# Patient Record
Sex: Female | Born: 1980 | Race: Asian | Hispanic: No | Marital: Married | Smoking: Never smoker
Health system: Southern US, Community
[De-identification: ages and names within clinical notes are randomized; demographics above are authoritative.]

## PROBLEM LIST (undated history)

## (undated) DIAGNOSIS — A159 Respiratory tuberculosis unspecified: Secondary | ICD-10-CM

---

## 2007-03-28 ENCOUNTER — Ambulatory Visit (HOSPITAL_COMMUNITY): Admission: RE | Admit: 2007-03-28 | Discharge: 2007-03-28 | Payer: Self-pay | Admitting: Family Medicine

## 2007-06-02 ENCOUNTER — Ambulatory Visit (HOSPITAL_COMMUNITY): Admission: RE | Admit: 2007-06-02 | Discharge: 2007-06-02 | Payer: Self-pay | Admitting: Family Medicine

## 2007-06-14 ENCOUNTER — Ambulatory Visit: Payer: Self-pay | Admitting: Family

## 2007-06-14 ENCOUNTER — Inpatient Hospital Stay (HOSPITAL_COMMUNITY): Admission: AD | Admit: 2007-06-14 | Discharge: 2007-06-17 | Payer: Self-pay | Admitting: Obstetrics and Gynecology

## 2007-06-15 ENCOUNTER — Ambulatory Visit: Payer: Self-pay | Admitting: Infectious Diseases

## 2007-12-04 ENCOUNTER — Emergency Department (HOSPITAL_COMMUNITY): Admission: EM | Admit: 2007-12-04 | Discharge: 2007-12-04 | Payer: Self-pay | Admitting: Family Medicine

## 2007-12-10 ENCOUNTER — Emergency Department (HOSPITAL_COMMUNITY): Admission: EM | Admit: 2007-12-10 | Discharge: 2007-12-10 | Payer: Self-pay | Admitting: Family Medicine

## 2009-06-01 ENCOUNTER — Emergency Department (HOSPITAL_COMMUNITY): Admission: EM | Admit: 2009-06-01 | Discharge: 2009-06-01 | Payer: Self-pay | Admitting: Emergency Medicine

## 2009-11-17 ENCOUNTER — Emergency Department (HOSPITAL_COMMUNITY)
Admission: EM | Admit: 2009-11-17 | Discharge: 2009-11-17 | Payer: Self-pay | Source: Home / Self Care | Admitting: Emergency Medicine

## 2010-04-30 LAB — COMPREHENSIVE METABOLIC PANEL
ALT: 11 U/L (ref 0–35)
AST: 18 U/L (ref 0–37)
CO2: 29 mEq/L (ref 19–32)
Calcium: 9.1 mg/dL (ref 8.4–10.5)
GFR calc Af Amer: >60 mL/min (ref >60)
GFR calc non Af Amer: >60 mL/min (ref >60)
Potassium: 3.2 mEq/L — ABNORMAL LOW (ref 3.5–5.1)
Sodium: 137 mEq/L (ref 135–145)

## 2010-04-30 LAB — URINALYSIS, ROUTINE W REFLEX MICROSCOPIC
Bilirubin Urine: NEGATIVE
Glucose, UA: NEGATIVE mg/dL
Ketones, ur: NEGATIVE mg/dL
Leukocytes, UA: NEGATIVE
pH: 6 (ref 5.0–8.0)

## 2010-04-30 LAB — DIFFERENTIAL
Eosinophils Absolute: 0.3 10*3/uL (ref 0.0–0.7)
Eosinophils Relative: 3 % (ref 0–5)
Lymphs Abs: 2.3 10*3/uL (ref 0.7–4.0)
Monocytes Relative: 7 % (ref 3–12)

## 2010-04-30 LAB — URINE MICROSCOPIC-ADD ON

## 2010-04-30 LAB — CBC
Hemoglobin: 13.5 g/dL (ref 12.0–15.0)
MCHC: 34.4 g/dL (ref 30.0–36.0)
RBC: 4.39 MIL/uL (ref 3.87–5.11)
WBC: 9.7 10*3/uL (ref 4.0–10.5)

## 2010-11-10 LAB — CBC
MCHC: 34.5
MCV: 92.6
Platelets: 194
WBC: 13.1 — ABNORMAL HIGH

## 2010-11-10 LAB — RPR: RPR Ser Ql: NONREACTIVE

## 2014-04-05 ENCOUNTER — Emergency Department (HOSPITAL_COMMUNITY): Admission: EM | Admit: 2014-04-05 | Discharge: 2014-04-05 | Discharge status: Absent without leave | Payer: Self-pay

## 2014-04-11 ENCOUNTER — Emergency Department (INDEPENDENT_AMBULATORY_CARE_PROVIDER_SITE_OTHER)
Admission: EM | Admit: 2014-04-11 | Discharge: 2014-04-11 | Disposition: A | Discharge status: Home / Self Care | Payer: Self-pay | Source: Home / Self Care | Attending: Emergency Medicine | Admitting: Emergency Medicine

## 2014-04-11 ENCOUNTER — Encounter (HOSPITAL_COMMUNITY): Payer: Self-pay | Admitting: Emergency Medicine

## 2014-04-11 ENCOUNTER — Emergency Department (INDEPENDENT_AMBULATORY_CARE_PROVIDER_SITE_OTHER): Payer: Self-pay

## 2014-04-11 DIAGNOSIS — Z09 Encounter for follow-up examination after completed treatment for conditions other than malignant neoplasm: Secondary | ICD-10-CM

## 2014-04-11 NOTE — ED Notes (Signed)
Patient is requesting a chest xray.  Patient has a history of tb, was treated.  Patient denies any symptoms.  Patient reports she was told to get chest xray every 5 years and she needs it for school.  Has no pcp

## 2014-04-11 NOTE — Discharge Instructions (Signed)
Please take this paperwork to the Occupational Health department. They will take care of getting the blood work and doing the physical. Your chest x-ray is normal.  I have provided a copy of the report.

## 2014-04-11 NOTE — ED Provider Notes (Signed)
CSN: [id number]     Arrival date & time 04/11/14  6213 History   First MD Initiated Contact with Patient 04/11/14 [phone]     Chief Complaint  Patient presents with  . Follow-up   (Consider location/radiation/quality/duration/timing/severity/associated sxs/prior Treatment) HPI  She is a 34 year old woman here for a chest x-ray. She states she had TB in 2003. She was treated. 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 She also has a form that needs to be filled out. She denies any cough, fevers, night sweats, weight loss.  History reviewed. No pertinent past medical history. History reviewed. No pertinent past surgical history. No family history on file. History  Substance Use Topics  . Smoking status: Never Smoker   . Smokeless tobacco: Not on file  . Alcohol Use: No   OB History    No data available     Review of Systems  All other systems reviewed and are negative.   Allergies  Review of patient's allergies indicates no known allergies.  Home Medications   Prior to Admission medications   Not on File   BP 123/73 mmHg  Pulse 78  Temp(Src) 98.7 F (37.1 C) (Oral)  Resp 16  Wt 120 lb (54.432 kg)  SpO2 100%  LMP 03/23/2014 Physical Exam  Constitutional: She is oriented to person, place, and time. She appears well-developed and well-nourished. No distress.  HENT:  Head: Normocephalic and atraumatic.  Right Ear: Tympanic membrane normal.  Left Ear: Tympanic membrane normal.  Nose: Nose normal.  Mouth/Throat: Oropharynx is clear and moist. No oropharyngeal exudate.  Eyes: Conjunctivae and EOM are normal. Pupils are equal, round, and reactive to light. Right eye exhibits no discharge. Left eye exhibits no discharge. No scleral icterus.  Neck: Neck supple.  Cardiovascular: Normal rate, regular rhythm and normal heart sounds.   No murmur heard. Pulmonary/Chest: Effort normal and breath sounds normal. No respiratory distress.  She has no wheezes. She has no rales.  Abdominal: Soft. Bowel sounds are normal. She exhibits no distension and no mass. There is no tenderness. There is no rebound and no guarding.  Musculoskeletal: Normal range of motion.       Cervical back: Normal.       Thoracic back: Normal.       Lumbar back: Normal.  Lymphadenopathy:    She has no cervical adenopathy.  Neurological: She is alert and oriented to person, place, and time. No cranial nerve deficit. She exhibits normal muscle tone. Coordination normal.  Skin: Skin is warm and dry. No rash noted.  Psychiatric: She has a normal mood and affect. Judgment and thought content normal.    ED Course  Procedures (including critical care time) Labs Review Labs Reviewed - No data to display  Imaging Review Dg Chest 2 View  04/11/2014   CLINICAL DATA:  Followup, positive TB test  EXAM: CHEST  2 VIEW  COMPARISON:  06/16/2007  FINDINGS: Cardiomediastinal silhouette is stable. There is no acute infiltrate or pleural effusion. No pulmonary edema. Stable somewhat nodular scarring in left upper lobe. Bony thorax is unremarkable. No adenopathy is suggested.  IMPRESSION: No active disease. No significant change. Stable somewhat nodular scarring in left upper lobe. No adenopathy is suggested.   Electronically Signed   By: Natasha Mead M.D.   On: 04/11/2014 10:21     MDM   1. Follow-up exam    Chest x-ray is negative for any active TB. I spoke with  occupational health. They will take care of the physical and lab work portion of her requirements. I provided her a copy of the x-ray report and instructed her to follow-up at occupational health.    Charm RingsErin J Honig, MD 04/11/14 1044

## 2016-09-05 IMAGING — DX DG CHEST 2V
2 series · 2 of 2 positions shown · non-contrast
Comparison: 06/16/2007

CLINICAL DATA: Followup, positive TB test

EXAM:
CHEST  2 VIEW

[chest pa]
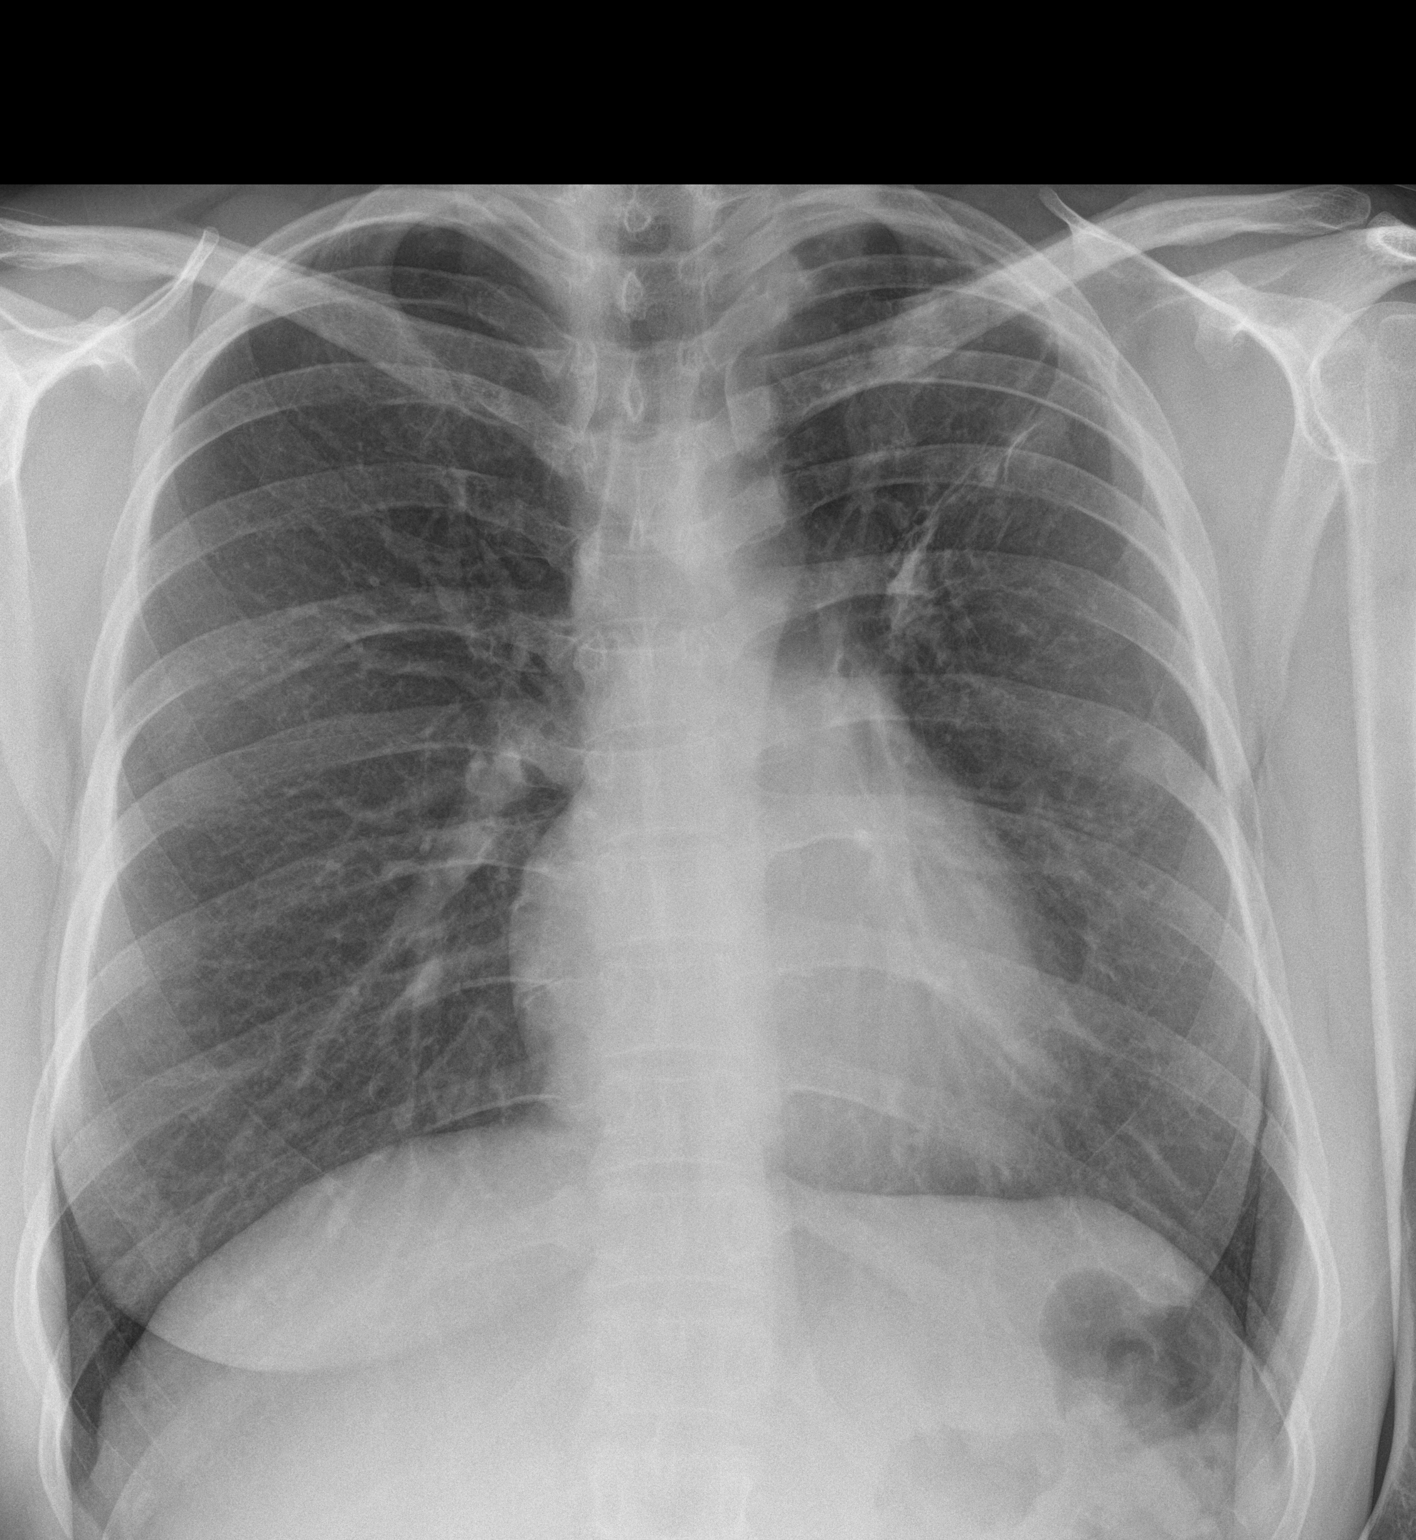

[chest lat]
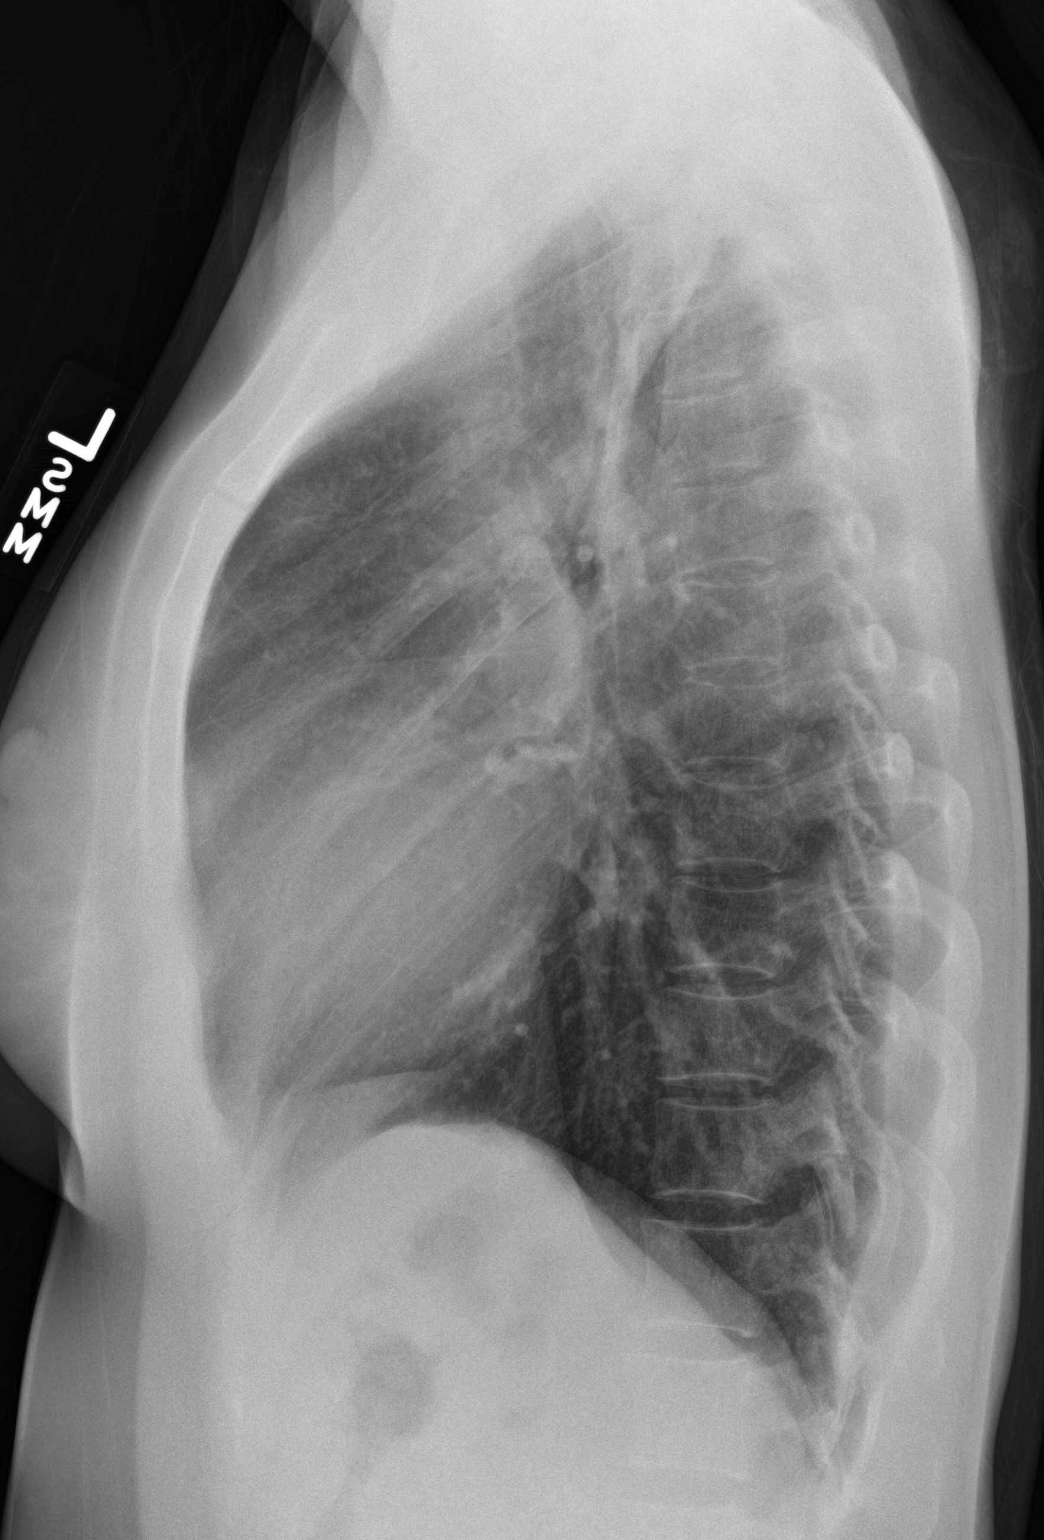

[2 of 2 positions shown; findings below may reference images not displayed]

FINDINGS: Cardiomediastinal silhouette is stable. There is no acute infiltrate
or pleural effusion. No pulmonary edema. Stable somewhat nodular
scarring in left upper lobe. Bony thorax is unremarkable. No
adenopathy is suggested.
IMPRESSION: No active disease. No significant change. Stable somewhat nodular
scarring in left upper lobe. No adenopathy is suggested.

## 2021-01-02 ENCOUNTER — Ambulatory Visit (HOSPITAL_COMMUNITY)
Admission: EM | Admit: 2021-01-02 | Discharge: 2021-01-02 | Disposition: A | Discharge status: Home / Self Care | Payer: Self-pay | Attending: Medical Oncology | Admitting: Medical Oncology

## 2021-01-02 ENCOUNTER — Other Ambulatory Visit: Payer: Self-pay

## 2021-01-02 ENCOUNTER — Encounter (HOSPITAL_COMMUNITY): Payer: Self-pay

## 2021-01-02 ENCOUNTER — Ambulatory Visit (INDEPENDENT_AMBULATORY_CARE_PROVIDER_SITE_OTHER): Payer: Self-pay

## 2021-01-02 DIAGNOSIS — R7611 Nonspecific reaction to tuberculin skin test without active tuberculosis: Secondary | ICD-10-CM

## 2021-01-02 NOTE — ED Provider Notes (Signed)
[facility]    CSN: [id number] Arrival date & time: 01/02/21  1802      History   Chief Complaint Positive PPD   HPI Rachael Boyer is a 39 y.o. female.   HPI  Positive PPD: Patient reports that she had tuberculosis in 2003.  She states that she was treated successfully and has had clearance since.  She reports that normally she does not do PPD test but and says does chest x-rays but had to perform 1 for a new employer.  It was positive.  She needs a chest x-ray.  She denies any recent cough, weight loss, fatigue, night sweats, hemoptysis, shortness of breath or chest pain.  She states that her blood pressure is elevated today due to being in office. She reports that her normal home BP is normal.   History reviewed. No pertinent past medical history.  There are no problems to display for this patient.   History reviewed. No pertinent surgical history.  OB History   No obstetric history on file.      Home Medications    Prior to Admission medications   Not on File    Family History History reviewed. No pertinent family history.  Social History Social History   Tobacco Use   Smoking status: Never  Substance Use Topics   Alcohol use: No   Drug use: No     Allergies   Patient has no known allergies.   Review of Systems Review of Systems  As stated above in HPI Physical Exam Triage Vital Signs ED Triage Vitals  Enc Vitals Group     BP 01/02/21 1907 (!) 182/84     Pulse Rate 01/02/21 1907 (!) 59     Resp 01/02/21 1907 18     Temp 01/02/21 1907 98.3 F (36.8 C)     Temp Source 01/02/21 1907 Oral     SpO2 01/02/21 1907 98 %     Weight --      Height --      Head Circumference --      Peak Flow --      Pain Score 01/02/21 1910 0     Pain Loc --      Pain Edu? --      Excl. in GC? --    No data found.  Updated Vital Signs BP (!) 182/84   Pulse (!) 59   Temp 98.3 F (36.8 C) (Oral)   Resp 18   LMP 12/19/2020   SpO2 98%    Physical Exam Vitals and nursing note reviewed.  Constitutional:      General: She is not in acute distress.    Appearance: Normal appearance. She is not ill-appearing, toxic-appearing or diaphoretic.  HENT:     Head: Normocephalic and atraumatic.  Cardiovascular:     Rate and Rhythm: Normal rate and regular rhythm.     Heart sounds: Normal heart sounds.  Pulmonary:     Effort: Pulmonary effort is normal. No respiratory distress.     Breath sounds: Normal breath sounds. No stridor. No wheezing, rhonchi or rales.  Chest:     Chest wall: No tenderness.  Neurological:     Mental Status: She is alert and oriented to person, place, and time.     UC Treatments / Results  Labs (all labs ordered are listed, but only abnormal results are displayed) Labs Reviewed - No data to display  EKG   Radiology DG Chest 2 View  Result Date:  01/02/2021 CLINICAL DATA:  Positive PPD EXAM: CHEST - 2 VIEW COMPARISON:  04/11/2014 FINDINGS: Frontal and lateral views of the chest demonstrate a stable cardiac silhouette. There is chronic scarring within the left upper lobe, stable. No acute airspace disease, effusion, or pneumothorax. No acute bony abnormalities. IMPRESSION: 1. Stable left upper lobe scarring. 2. No acute intrathoracic process. No evidence of active tuberculosis. Electronically Signed   By: Sharlet Salina M.D.   On: 01/02/2021 19:37    Procedures Procedures (including critical care time)  Medications Ordered in UC Medications - No data to display  Initial Impression / Assessment and Plan / UC Course  I have reviewed the triage vital signs and the nursing notes.  Pertinent labs & imaging results that were available during my care of the patient were reviewed by me and considered in my medical decision making (see chart for details).     New.  Chest x-ray is nonconcerning for acute TB.  Discussed red flag signs and symptoms.  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 Follow up PRN.  Final Clinical Impressions(s) / UC Diagnoses   Final diagnoses:  Positive PPD   Discharge Instructions   None    ED Prescriptions   None    PDMP not reviewed this encounter.   Rushie Chestnut, [place] 01/02/21 1956

## 2021-01-02 NOTE — ED Triage Notes (Signed)
Pt had a positive PPD for work and was instructed to come for a chest Xray.

## 2021-05-29 ENCOUNTER — Other Ambulatory Visit (HOSPITAL_BASED_OUTPATIENT_CLINIC_OR_DEPARTMENT_OTHER): Payer: Self-pay

## 2022-07-24 ENCOUNTER — Ambulatory Visit (INDEPENDENT_AMBULATORY_CARE_PROVIDER_SITE_OTHER): Payer: BC Managed Care – PPO

## 2022-07-24 ENCOUNTER — Encounter (HOSPITAL_COMMUNITY): Payer: Self-pay | Admitting: Emergency Medicine

## 2022-07-24 ENCOUNTER — Ambulatory Visit (HOSPITAL_COMMUNITY)
Admission: EM | Admit: 2022-07-24 | Discharge: 2022-07-24 | Disposition: A | Discharge status: Home / Self Care | Payer: BC Managed Care – PPO | Attending: Emergency Medicine | Admitting: Emergency Medicine

## 2022-07-24 DIAGNOSIS — R042 Hemoptysis: Secondary | ICD-10-CM

## 2022-07-24 DIAGNOSIS — Z8611 Personal history of tuberculosis: Secondary | ICD-10-CM

## 2022-07-24 HISTORY — DX: Respiratory tuberculosis unspecified: A15.9

## 2022-07-24 NOTE — ED Provider Notes (Signed)
[facility]    CSN: [id number] Arrival date & time: 07/24/22  1039      History   Chief Complaint Chief Complaint  Patient presents with   Cough    HPI Rachael Boyer is a 42 y.o. female.   Patient presents to clinic for complaints of coughing up blood yesterday.  She coughed up a handful of blood around 11 PM yesterday.  She also recently coughed up blood on May 13th.   She denies any weight changes, fatigue, night sweats, recent illness and does not smoke.  No fevers.   The history is provided by the patient and medical records.  Cough Associated symptoms: no chest pain, no fever, no shortness of breath and no sore throat     Past Medical History:  Diagnosis Date   Tuberculosis     There are no problems to display for this patient.   History reviewed. No pertinent surgical history.  OB History   No obstetric history on file.      Home Medications    Prior to Admission medications   Not on File    Family History No family history on file.  Social History Social History   Tobacco Use   Smoking status: Never  Substance Use Topics   Alcohol use: No   Drug use: No     Allergies   Patient has no known allergies.   Review of Systems Review of Systems  Constitutional:  Negative for fatigue, fever and unexpected weight change.  HENT:  Negative for sore throat.   Respiratory:  Positive for cough. Negative for shortness of breath.   Cardiovascular:  Negative for chest pain.     Physical Exam Triage Vital Signs ED Triage Vitals  Enc Vitals Group     BP 07/24/22 1208 135/79     Pulse Rate 07/24/22 1208 (!) 54     Resp 07/24/22 1208 15     Temp 07/24/22 1208 98.2 F (36.8 C)     Temp Source 07/24/22 1208 Oral     SpO2 07/24/22 1208 99 %     Weight --      Height --      Head Circumference --      Peak Flow --      Pain Score 07/24/22 1207 0     Pain Loc --      Pain Edu? --      Excl. in GC? --    No data  found.  Updated Vital Signs BP 135/79 (BP Location: Left Arm)   Pulse (!) 54   Temp 98.2 F (36.8 C) (Oral)   Resp 15   LMP 07/13/2022   SpO2 99%   Visual Acuity Right Eye Distance:   Left Eye Distance:   Bilateral Distance:    Right Eye Near:   Left Eye Near:    Bilateral Near:     Physical Exam Vitals and nursing note reviewed.  Constitutional:      Appearance: Normal appearance.  HENT:     Head: Normocephalic and atraumatic.     Right Ear: External ear normal.     Left Ear: External ear normal.     Nose: Nose normal.     Mouth/Throat:     Mouth: Mucous membranes are moist.  Eyes:     Conjunctiva/sclera: Conjunctivae normal.  Cardiovascular:     Rate and Rhythm: Normal rate and regular rhythm.     Heart sounds: Normal heart sounds. No murmur heard.  Pulmonary:     Effort: Pulmonary effort is normal. No respiratory distress.     Breath sounds: Normal breath sounds.  Neurological:     General: No focal deficit present.     Mental Status: She is alert and oriented to person, place, and time.  Psychiatric:        Mood and Affect: Mood normal.        Behavior: Behavior normal.      UC Treatments / Results  Labs (all labs ordered are listed, but only abnormal results are displayed) Labs Reviewed - No data to display  EKG   Radiology DG Chest 2 View  Result Date: 07/24/2022 CLINICAL DATA:  History of TB with hemoptysis EXAM: CHEST - 2 VIEW COMPARISON:  01/02/2021 FINDINGS: Band of scarring with volume loss above the left hilum. Mild reticulation lateral to the left hilum. Bronchiectasis suggested on the lateral view. Normal heart size and aortic contours. Clear right lung. IMPRESSION: Stable scarring with probable bronchiectasis at the left upper lobe. Electronically Signed   By: Tiburcio Pea M.D.   On: 07/24/2022 12:52    Procedures Procedures (including critical care time)  Medications Ordered in UC Medications - No data to display  Initial Impression  / Assessment and Plan / UC Course  I have reviewed the triage vital signs and the nursing notes.  Pertinent labs & imaging results that were available during my care of the patient were reviewed by me and considered in my medical decision making (see chart for details).  Vitals and triage reviewed, patient is hemodynamically stable.  Patient has has hemoptysis since yesterday, does have a history of TB.  Reports that is latent.  Denies any other symptoms such as fatigue, night sweats, weight loss, fevers or recent illness.  Chest x-ray shows stable scarring.  Will refer to infectious disease for further management.    Final Clinical Impressions(s) / UC Diagnoses   Final diagnoses:  Hemoptysis  History of tuberculosis     Discharge Instructions      We obtained a chest x-ray today and I will contact you if there is anything emergent.  It is important that you follow-up with infectious disease given your history of tuberculosis.  Please return to clinic for any new or concerning symptoms.     ED Prescriptions   None    PDMP not reviewed this encounter.   Manuella Blackson, [place], [place] 07/24/22 1257

## 2022-07-24 NOTE — ED Triage Notes (Signed)
Pt reports coughed up blood back 5/13 then again last night. Reports that bright red blood and large amounts.  Hx tuberculosis

## 2022-07-24 NOTE — Discharge Instructions (Addendum)
We obtained a chest x-ray today and I will contact you if there is anything emergent.  It is important that you follow-up with infectious disease given your history of tuberculosis.  Please return to clinic for any new or concerning symptoms.

## 2022-07-27 ENCOUNTER — Other Ambulatory Visit: Payer: Self-pay

## 2022-07-27 ENCOUNTER — Emergency Department (HOSPITAL_COMMUNITY)
Admission: EM | Admit: 2022-07-27 | Discharge: 2022-07-27 | Disposition: A | Discharge status: Home / Self Care | Payer: BC Managed Care – PPO | Attending: Emergency Medicine | Admitting: Emergency Medicine

## 2022-07-27 DIAGNOSIS — R042 Hemoptysis: Secondary | ICD-10-CM | POA: Diagnosis present

## 2022-07-27 LAB — COMPREHENSIVE METABOLIC PANEL
ALT: 23 U/L (ref 0–44)
AST: 24 U/L (ref 15–41)
Albumin: 4 g/dL (ref 3.5–5.0)
Alkaline Phosphatase: 54 U/L (ref 38–126)
Anion gap: 9 (ref 5–15)
BUN: 7 mg/dL (ref 6–20)
CO2: 23 mmol/L (ref 22–32)
Calcium: 9.2 mg/dL (ref 8.9–10.3)
Chloride: 105 mmol/L (ref 98–111)
Creatinine, Ser: 0.8 mg/dL (ref 0.44–1.00)
GFR, Estimated: >60 mL/min (ref >60)
Glucose, Bld: 84 mg/dL (ref 70–99)
Potassium: 3.6 mmol/L (ref 3.5–5.1)
Sodium: 137 mmol/L (ref 135–145)
Total Bilirubin: 0.4 mg/dL (ref 0.3–1.2)
Total Protein: 7.8 g/dL (ref 6.5–8.1)

## 2022-07-27 LAB — CBC WITH DIFFERENTIAL/PLATELET
Abs Immature Granulocytes: 0.03 10*3/uL (ref 0.00–0.07)
Basophils Absolute: 0.1 10*3/uL (ref 0.0–0.1)
Basophils Relative: 1 %
Eosinophils Absolute: 0.3 10*3/uL (ref 0.0–0.5)
Eosinophils Relative: 4 %
HCT: 41.3 % (ref 36.0–46.0)
Hemoglobin: 13.8 g/dL (ref 12.0–15.0)
Immature Granulocytes: 0 %
Lymphocytes Relative: 30 %
Lymphs Abs: 2.2 10*3/uL (ref 0.7–4.0)
MCH: 30.8 pg (ref 26.0–34.0)
MCHC: 33.4 g/dL (ref 30.0–36.0)
MCV: 92.2 fL (ref 80.0–100.0)
Monocytes Absolute: 0.8 10*3/uL (ref 0.1–1.0)
Monocytes Relative: 10 %
Neutro Abs: 4 10*3/uL (ref 1.7–7.7)
Neutrophils Relative %: 55 %
Platelets: 286 10*3/uL (ref 150–400)
RBC: 4.48 MIL/uL (ref 3.87–5.11)
RDW: 12.1 % (ref 11.5–15.5)
WBC: 7.4 10*3/uL (ref 4.0–10.5)
nRBC: 0 % (ref 0.0–0.2)

## 2022-07-27 LAB — PREGNANCY, URINE: Preg Test, Ur: NEGATIVE

## 2022-07-27 LAB — I-STAT BETA HCG BLOOD, ED (MC, WL, AP ONLY): I-stat hCG, quantitative: 7.2 m[IU]/mL — ABNORMAL HIGH (ref <5)

## 2022-07-27 LAB — POC OCCULT BLOOD, ED: Fecal Occult Bld: NEGATIVE

## 2022-07-27 NOTE — ED Provider Notes (Signed)
[place] [facility] AT [facility] Provider Note   CSN: [id number] Arrival date & time: 07/27/22  1854     History  Chief Complaint  Patient presents with   Hematemesis    Danazia Detore is a 42 y.o. female.  Patient is a 42 year old female who presents with possible vomiting blood.  She had some small intermittent episodes since May 30.  She was seen in urgent care earlier this month and it was felt to be hemoptysis at that time.  She had a chest x-ray which showed no acute abnormality.  She does have a remote history of TB.  She said today she felt like she had some mucus in the back of her throat and it gargled up and it was a large handful of blood.  She says she has had 3 episodes where she felt like there was mucus in the back of her throat and she would spend about 5 minutes spitting it out.  She denies any nausea.  She does have dizziness which she describes more of a lightheadedness.  No abdominal pain.  No noticeable blood in her stools or black stools.  No shortness of breath.  No cough.  No history of similar symptoms in the past.  She has small amount of alcohol use but she says only 1 drink occasionally.  She denies any known peptic ulcer disease.       Home Medications Prior to Admission medications   Not on File      Allergies    Patient has no known allergies.    Review of Systems   Review of Systems  Constitutional:  Negative for chills, diaphoresis, fatigue and fever.  HENT:  Negative for congestion, rhinorrhea and sneezing.   Eyes: Negative.   Respiratory:  Negative for cough, chest tightness and shortness of breath.   Cardiovascular:  Negative for chest pain and leg swelling.  Gastrointestinal:  Positive for vomiting. Negative for abdominal pain, blood in stool, diarrhea and nausea.  Genitourinary:  Negative for difficulty urinating, flank pain, frequency and hematuria.  Musculoskeletal:  Negative for arthralgias and back pain.   Skin:  Negative for rash.  Neurological:  Positive for dizziness and light-headedness. Negative for speech difficulty, weakness, numbness and headaches.    Physical Exam Updated Vital Signs BP (!) 144/84   Pulse 64   Temp 98.3 F (36.8 C) (Oral)   Resp 16   LMP 07/13/2022   SpO2 100%  Physical Exam Constitutional:      Appearance: She is well-developed.  HENT:     Head: Normocephalic and atraumatic.     Mouth/Throat:     Comments: No blood in the posterior pharnyx Eyes:     Pupils: Pupils are equal, round, and reactive to light.  Cardiovascular:     Rate and Rhythm: Normal rate and regular rhythm.     Heart sounds: Normal heart sounds.  Pulmonary:     Effort: Pulmonary effort is normal. No respiratory distress.     Breath sounds: Normal breath sounds. No wheezing or rales.  Chest:     Chest wall: No tenderness.  Abdominal:     General: Bowel sounds are normal.     Palpations: Abdomen is soft.     Tenderness: There is no abdominal tenderness. There is no guarding or rebound.  Musculoskeletal:        General: Normal range of motion.     Cervical back: Normal range of motion and neck supple.  Lymphadenopathy:  Cervical: No cervical adenopathy.  Skin:    General: Skin is warm and dry.     Findings: No rash.  Neurological:     Mental Status: She is alert and oriented to person, place, and time.     ED Results / Procedures / Treatments   Labs (all labs ordered are listed, but only abnormal results are displayed) Labs Reviewed  I-STAT BETA HCG BLOOD, ED ([facility], [facility], [facility] ONLY) - Abnormal; Notable for the following components:      Result Value   I-stat hCG, quantitative 7.2 (*)    All other components within normal limits  CBC WITH DIFFERENTIAL/PLATELET  COMPREHENSIVE METABOLIC PANEL  PREGNANCY, URINE  POC OCCULT BLOOD, ED    EKG None  Radiology No results found.  Procedures Procedures    Medications Ordered in ED Medications - No data to display  ED  Course/ Medical Decision Making/ A&P                             Medical Decision Making Amount and/or Complexity of Data Reviewed Labs: ordered.   Patient is a 42 year old female who presents with some spitting up blood.  It is unclear where it is coming from.  It does not really sound like she is vomiting implied.  She does not have any signs of nausea.  She feels like it is pulling in the back of her throat and then she has to spit it out.  She does not have any noticeable nosebleeds.  She is not coughing.  She had a recent chest x-ray which was unrevealing.  I reviewed these results.  Her labs are nonconcerning.  Her hemoglobin is normal.  Her stool Hemoccult was negative.  She does not report any melena.  At this point I do not see an indication for hospitalization.  She does not have any ongoing bleeding.  She does not have a PCP.  Will refer her to a couple offices as options.  I will also refer to ENT to look for possible ENT etiologies.  She was given strict return precautions.  Final Clinical Impression(s) / ED Diagnoses Final diagnoses:  Coughing blood    Rx / DC Orders ED Discharge Orders     None         Rolan Bucco, MD 07/27/22 2232

## 2022-07-27 NOTE — Discharge Instructions (Signed)
Follow-up with ENT as discussed.  Return to the emergency room if you have any worsening symptoms.

## 2022-07-27 NOTE — ED Triage Notes (Signed)
Patient reports intermittent hematemesis since May 30 this year , denies abdominal pain or diarrhea.

## 2022-08-11 ENCOUNTER — Ambulatory Visit: Payer: BC Managed Care – PPO | Admitting: Student

## 2022-08-11 VITALS — BP 133/72 | HR 86 | Temp 97.6°F | Ht 64.0 in | Wt 130.0 lb

## 2022-08-11 DIAGNOSIS — R042 Hemoptysis: Secondary | ICD-10-CM | POA: Diagnosis not present

## 2022-08-11 DIAGNOSIS — Z1159 Encounter for screening for other viral diseases: Secondary | ICD-10-CM

## 2022-08-11 DIAGNOSIS — Z1231 Encounter for screening mammogram for malignant neoplasm of breast: Secondary | ICD-10-CM

## 2022-08-11 DIAGNOSIS — Z114 Encounter for screening for human immunodeficiency virus [HIV]: Secondary | ICD-10-CM

## 2022-08-11 DIAGNOSIS — Z8611 Personal history of tuberculosis: Secondary | ICD-10-CM | POA: Diagnosis not present

## 2022-08-11 NOTE — Progress Notes (Unsigned)
   CC: Establish care  HPI:  Rachael Boyer is a 42 y.o. female with PMH as below who presents to the clinic as a new patient to establish care.  Please see assessment and plan for further details.  Past Medical History Tuberculosis-treated 2003  Meds:  Ibuprofen as needed for cramps Cetirizine as needed   Past Surgical History No surgeries  Social:  Lives in [place] with her 2 kids.  Works as a Engineer, civil (consulting) at [employer].  Lives [place] 2 kids, nurse at [employer],  PCP: Rocky Morel, DO Substances: Occasional alcohol use.  No tobacco or drug use.  Family History:  Father - cystic fibrosis Mother with dm/htn  Allergies: Allergies as of 08/11/2022   (No Known Allergies)    Review of Systems:   Pertinent items noted in HPI and/or A&P.  Physical Exam:  Vitals:   08/11/22 1325  BP: 133/72  Pulse: 86  Temp: 97.6 F (36.4 C)  TempSrc: Oral  SpO2: 100%  Weight: 130 lb (59 kg)  Height: 5\' 4"  (1.626 m)    Constitutional: Well-appearing adult female. In no acute distress. HEENT: Normocephalic, atraumatic, Sclera non-icteric, PERRL, EOM intact Cardio:Regular rate and rhythm. 2+ bilateral radial and dorsalis pedis  pulses. Pulm:Clear to auscultation bilaterally. Normal work of breathing on room air. Abdomen: Soft, non-tender, non-distended, positive bowel sounds. WGN:FAOZHYQM for extremity edema. Skin:Warm and dry. Neuro:Alert and oriented x3. No focal deficit noted. Psych:Pleasant mood and affect.   Assessment & Plan:   Hemoptysis Presents with 3-4 discrete episodes of mild hemoptysis without other associated symptoms including pain, dyspnea, baseline cough, dysphagia, odynophagia, nausea, vomiting, other abnormal bruising or bleeding.  As elsewhere she was treated for TB in 2003 in [place].  She also has stable left upper lobe scarring versus bronchiectasis that is been present since at least 2009 on chest x-rays.  Differential includes acute bronchitis,  bronchiectasis, pulmonary AVM, endobronchial lesion, and TB.  Without any other imaging besides chest x-ray will further evaluate with a CT. -CT of the chest without contrast  History of TB (tuberculosis) TB still pending only on the differential for her hemoptysis but very unlikely without any other signs or symptoms of active TB and stable chest x-ray.  No need for sputum testing at this time.    Patient discussed with Dr. Julieanne Cotton, DO [facility] Internal [facility] Resident PGY-1 Pager: [phone]

## 2022-08-11 NOTE — Patient Instructions (Addendum)
  Thank you, Ms.Ardelle Park, for allowing [place] to provide your care today. Today we discussed . . .  > Coughing up blood       - We are going to send you for a CT scan of your lungs.  I will call you when we get the results of this back.  If you have any more episodes please call our clinic and we will see you soon as possible.  If you are coughing up a lot of blood or having chest pain or trouble breathing please go to the emergency department. > Cancer screening       -We will send referral for mammogram today and I will see back in 3 months to do a Pap smear.  I have ordered the following labs for you:   Lab Orders         Hepatitis C Ab reflex to Quant PCR         HIV antibody (with reflex)       Tests ordered today:  Chest CT   Referrals ordered today:   Referral Orders  No referral(s) requested today      I have ordered the following medication/changed the following medications:   Stop the following medications: There are no discontinued medications.   Start the following medications: No orders of the defined types were placed in this encounter.     Follow up: 4 months    Remember:     Should you have any questions or concerns please call the internal medicine clinic at [phone].     Rocky Morel, DO [facility]

## 2022-08-12 ENCOUNTER — Encounter: Payer: Self-pay | Admitting: Student

## 2022-08-12 DIAGNOSIS — Z8611 Personal history of tuberculosis: Secondary | ICD-10-CM | POA: Insufficient documentation

## 2022-08-12 DIAGNOSIS — R042 Hemoptysis: Secondary | ICD-10-CM | POA: Insufficient documentation

## 2022-08-12 LAB — HIV ANTIBODY (ROUTINE TESTING W REFLEX): HIV Screen 4th Generation wRfx: NONREACTIVE

## 2022-08-12 LAB — HCV AB W REFLEX TO QUANT PCR: HCV Ab: NONREACTIVE

## 2022-08-12 LAB — HCV INTERPRETATION

## 2022-08-12 NOTE — Progress Notes (Signed)
Mailed a copy of her negative HIV and hepatitis C screening labs.

## 2022-08-12 NOTE — Assessment & Plan Note (Signed)
TB still pending only on the differential for her hemoptysis but very unlikely without any other signs or symptoms of active TB and stable chest x-ray.  No need for sputum testing at this time.

## 2022-08-12 NOTE — Assessment & Plan Note (Addendum)
Presents with 3-4 discrete episodes of mild hemoptysis without other associated symptoms including pain, dyspnea, baseline cough, dysphagia, odynophagia, nausea, vomiting, other abnormal bruising or bleeding.  As elsewhere she was treated for TB in 2003 in [place].  She also has stable left upper lobe scarring versus bronchiectasis that is been present since at least 2009 on chest x-rays.  Differential includes acute bronchitis, bronchiectasis, pulmonary AVM, endobronchial lesion, and TB.  Without any other imaging besides chest x-ray will further evaluate with a CT. -CT of the chest without contrast

## 2022-08-13 NOTE — Progress Notes (Signed)
[facility] Attending  Case discussed with Dr. Goodwin  At the time of the visit.  We reviewed the resident's history and exam and pertinent patient test results.  I agree with the assessment, diagnosis, and plan of care documented in the resident's note.  

## 2022-09-02 ENCOUNTER — Ambulatory Visit: Payer: BC Managed Care – PPO

## 2022-09-09 ENCOUNTER — Ambulatory Visit
Admission: RE | Admit: 2022-09-09 | Discharge: 2022-09-09 | Disposition: A | Discharge status: Home / Self Care | Payer: BC Managed Care – PPO | Source: Ambulatory Visit | Attending: Student in an Organized Health Care Education/Training Program | Admitting: Student in an Organized Health Care Education/Training Program

## 2022-09-09 DIAGNOSIS — Z1231 Encounter for screening mammogram for malignant neoplasm of breast: Secondary | ICD-10-CM

## 2022-09-14 ENCOUNTER — Other Ambulatory Visit: Payer: Self-pay | Admitting: Student in an Organized Health Care Education/Training Program

## 2022-09-14 DIAGNOSIS — R928 Other abnormal and inconclusive findings on diagnostic imaging of breast: Secondary | ICD-10-CM

## 2022-09-21 ENCOUNTER — Ambulatory Visit: Payer: BC Managed Care – PPO

## 2022-09-21 ENCOUNTER — Ambulatory Visit
Admission: RE | Admit: 2022-09-21 | Discharge: 2022-09-21 | Disposition: A | Discharge status: Home / Self Care | Payer: BC Managed Care – PPO | Source: Ambulatory Visit | Attending: Student in an Organized Health Care Education/Training Program | Admitting: Student in an Organized Health Care Education/Training Program

## 2022-09-21 DIAGNOSIS — R928 Other abnormal and inconclusive findings on diagnostic imaging of breast: Secondary | ICD-10-CM

## 2022-09-21 NOTE — Progress Notes (Signed)
Pt contacted by radiology for results of screening mammogram concerning for malignancy and scheduled for diagnostic mammogram which showed no evidence of malignancy.

## 2022-12-20 ENCOUNTER — Ambulatory Visit (HOSPITAL_COMMUNITY): Payer: BC Managed Care – PPO

## 2023-05-30 IMAGING — DX DG CHEST 2V
2 series · 2 of 2 positions shown · non-contrast
Comparison: 04/11/2014

CLINICAL DATA: Positive PPD

EXAM:
CHEST - 2 VIEW

[chest pa]
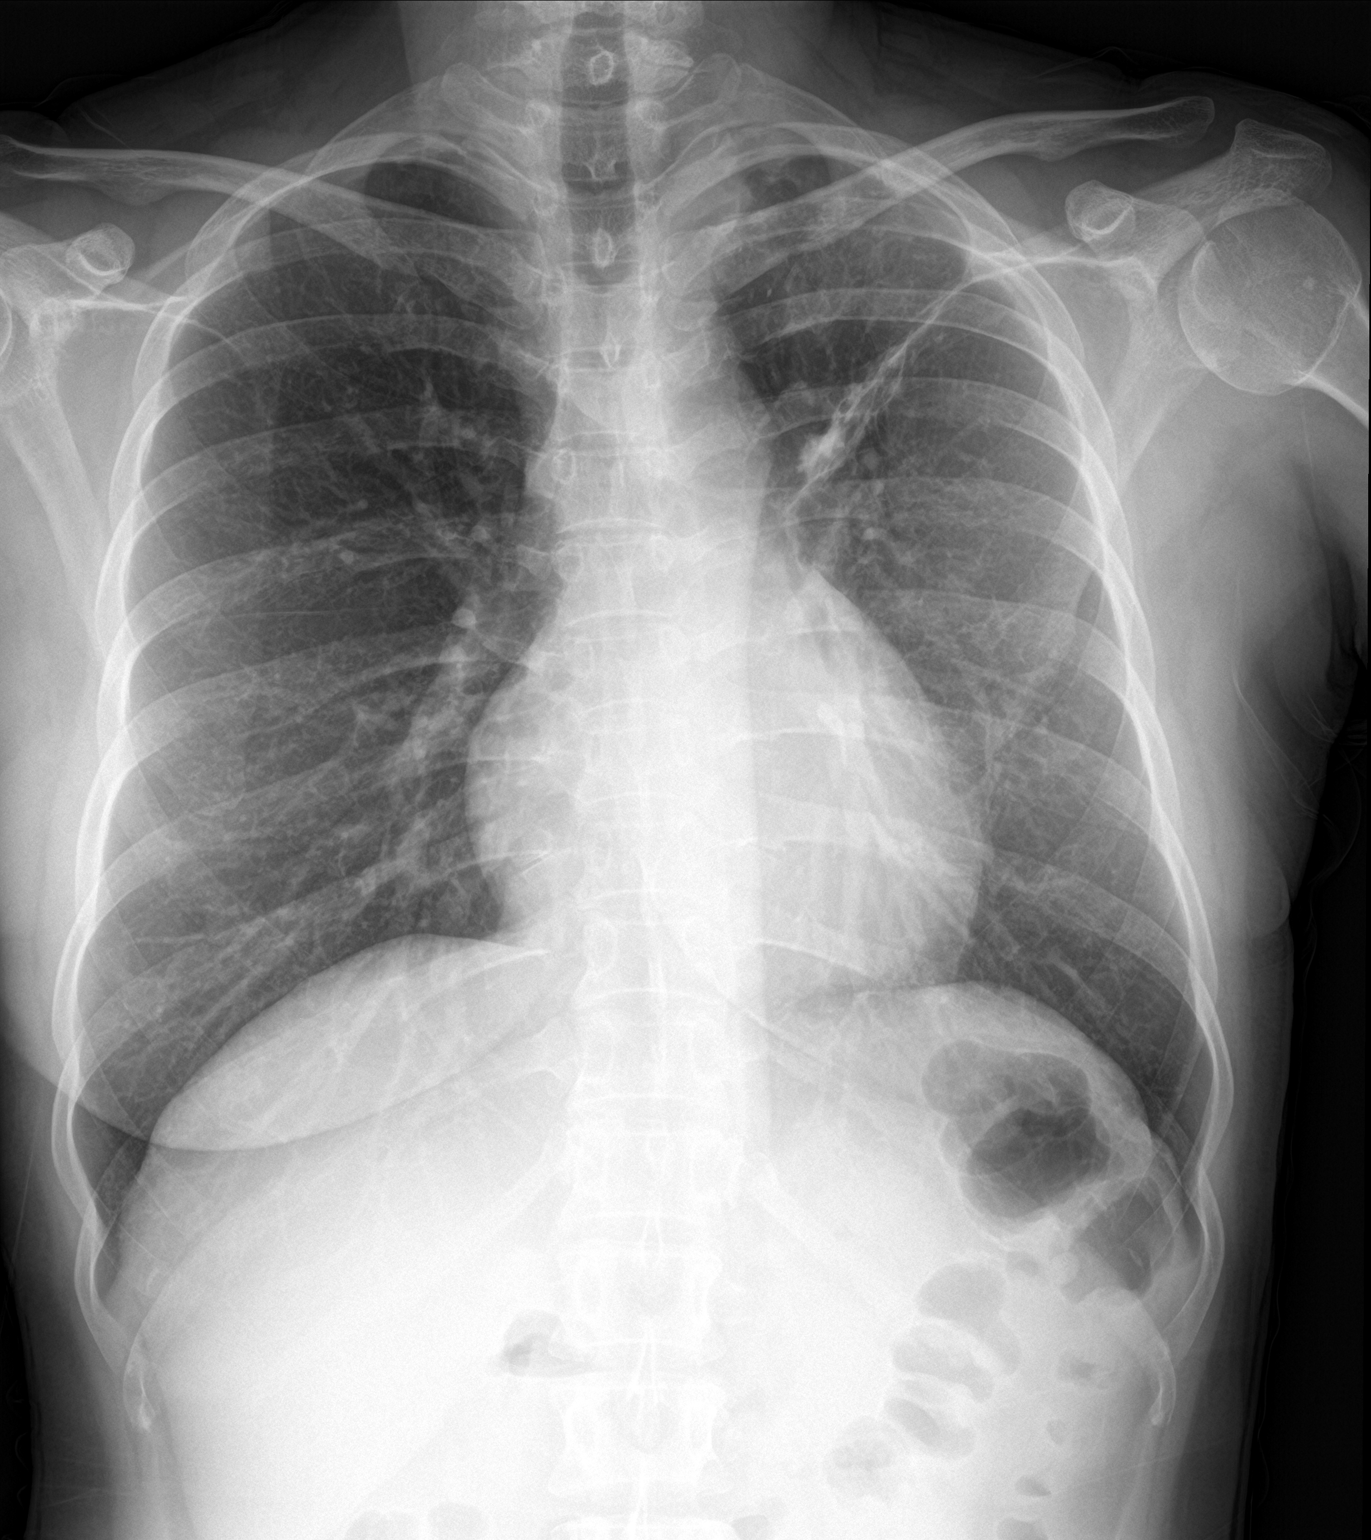

[chest lat]
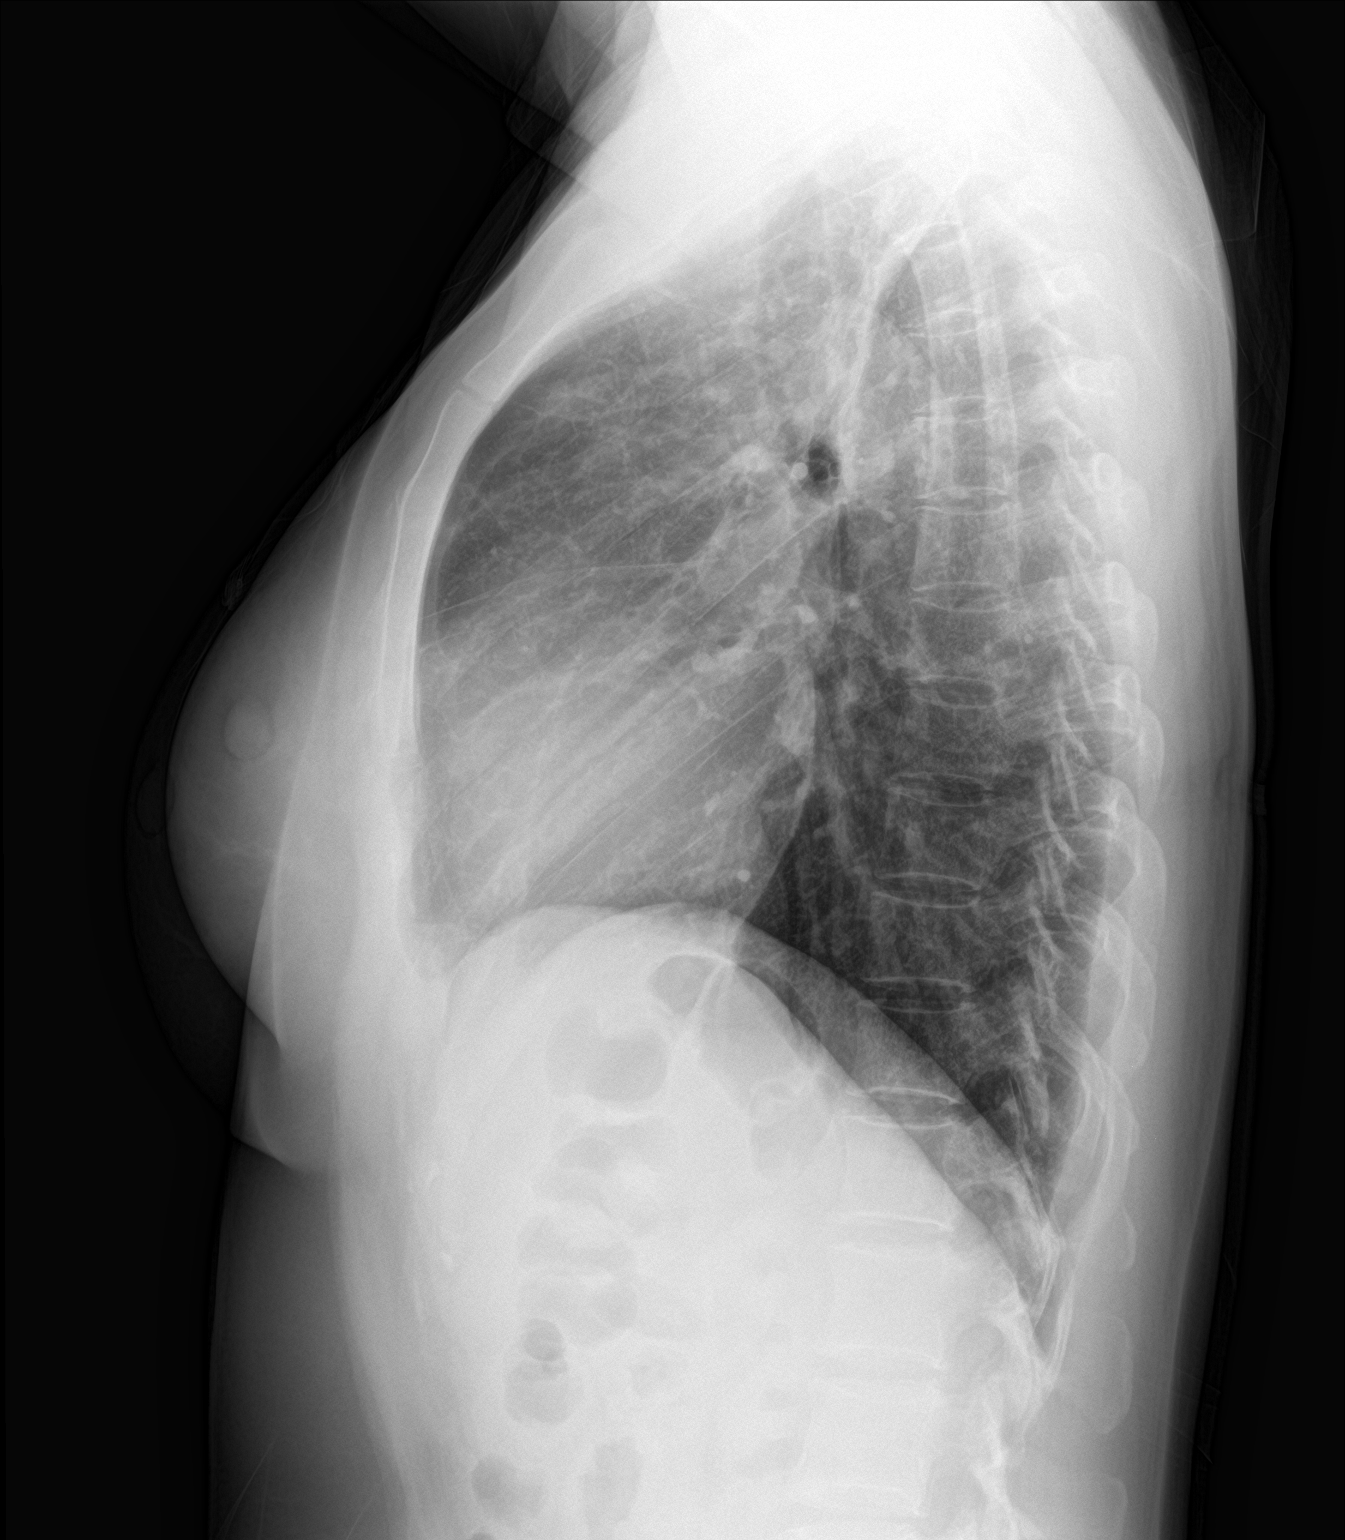

[2 of 2 positions shown; findings below may reference images not displayed]

FINDINGS: Frontal and lateral views of the chest demonstrate a stable cardiac
silhouette. There is chronic scarring within the left upper lobe,
stable. No acute airspace disease, effusion, or pneumothorax. No
acute bony abnormalities.
IMPRESSION: 1. Stable left upper lobe scarring.
2. No acute intrathoracic process. No evidence of active
tuberculosis.

## 2023-11-03 ENCOUNTER — Telehealth: Payer: Self-pay

## 2023-11-03 NOTE — Telephone Encounter (Signed)
 Patient last seen 08/11/22 I called the patient to schedule a appointment. Unable to reach the patient, I lvm for her to give us  a call back.
